# Patient Record
Sex: Male | Born: 1975 | Race: White | Hispanic: No | Marital: Married | Smoking: Never smoker
Health system: Southern US, Community
[De-identification: ages and names within clinical notes are randomized; demographics above are authoritative.]

## PROBLEM LIST (undated history)

## (undated) DIAGNOSIS — M109 Gout, unspecified: Secondary | ICD-10-CM

---

## 2014-06-26 ENCOUNTER — Ambulatory Visit
Admission: RE | Admit: 2014-06-26 | Discharge: 2014-06-26 | Disposition: A | Discharge status: Home / Self Care | Payer: BC Managed Care – PPO | Source: Ambulatory Visit | Attending: Family Medicine | Admitting: Family Medicine

## 2014-06-26 ENCOUNTER — Other Ambulatory Visit: Payer: Self-pay | Admitting: Family Medicine

## 2014-06-26 DIAGNOSIS — M25571 Pain in right ankle and joints of right foot: Secondary | ICD-10-CM

## 2016-01-20 DIAGNOSIS — Z13220 Encounter for screening for lipoid disorders: Secondary | ICD-10-CM | POA: Diagnosis not present

## 2016-01-20 DIAGNOSIS — Z0000 Encounter for general adult medical examination without abnormal findings: Secondary | ICD-10-CM | POA: Diagnosis not present

## 2016-01-20 DIAGNOSIS — Z125 Encounter for screening for malignant neoplasm of prostate: Secondary | ICD-10-CM | POA: Diagnosis not present

## 2016-01-20 DIAGNOSIS — M109 Gout, unspecified: Secondary | ICD-10-CM | POA: Diagnosis not present

## 2016-05-03 DIAGNOSIS — Z23 Encounter for immunization: Secondary | ICD-10-CM | POA: Diagnosis not present

## 2016-06-08 DIAGNOSIS — M542 Cervicalgia: Secondary | ICD-10-CM | POA: Diagnosis not present

## 2016-06-08 DIAGNOSIS — M25562 Pain in left knee: Secondary | ICD-10-CM | POA: Diagnosis not present

## 2016-07-04 DIAGNOSIS — M109 Gout, unspecified: Secondary | ICD-10-CM | POA: Diagnosis not present

## 2016-07-04 DIAGNOSIS — K529 Noninfective gastroenteritis and colitis, unspecified: Secondary | ICD-10-CM | POA: Diagnosis not present

## 2016-07-28 ENCOUNTER — Emergency Department (HOSPITAL_COMMUNITY)
Admission: EM | Admit: 2016-07-28 | Discharge: 2016-07-28 | Disposition: A | Discharge status: Home / Self Care | Payer: BC Managed Care – PPO | Attending: Emergency Medicine | Admitting: Emergency Medicine

## 2016-07-28 ENCOUNTER — Emergency Department (HOSPITAL_COMMUNITY): Payer: BC Managed Care – PPO

## 2016-07-28 ENCOUNTER — Encounter (HOSPITAL_COMMUNITY): Payer: Self-pay

## 2016-07-28 DIAGNOSIS — N202 Calculus of kidney with calculus of ureter: Secondary | ICD-10-CM | POA: Diagnosis not present

## 2016-07-28 DIAGNOSIS — N201 Calculus of ureter: Secondary | ICD-10-CM

## 2016-07-28 DIAGNOSIS — R1032 Left lower quadrant pain: Secondary | ICD-10-CM | POA: Diagnosis present

## 2016-07-28 DIAGNOSIS — N132 Hydronephrosis with renal and ureteral calculous obstruction: Secondary | ICD-10-CM | POA: Diagnosis not present

## 2016-07-28 HISTORY — DX: Gout, unspecified: M10.9

## 2016-07-28 LAB — BASIC METABOLIC PANEL
ANION GAP: 15 (ref 5–15)
BUN: 25 mg/dL — ABNORMAL HIGH (ref 6–20)
CO2: 18 mmol/L — ABNORMAL LOW (ref 22–32)
Calcium: 9.4 mg/dL (ref 8.9–10.3)
Chloride: 103 mmol/L (ref 101–111)
Creatinine, Ser: 1.35 mg/dL — ABNORMAL HIGH (ref 0.61–1.24)
GFR calc Af Amer: >60 mL/min (ref >60)
GFR calc non Af Amer: >60 mL/min (ref >60)
GLUCOSE: 146 mg/dL — AB (ref 65–99)
POTASSIUM: 3.7 mmol/L (ref 3.5–5.1)
Sodium: 136 mmol/L (ref 135–145)

## 2016-07-28 LAB — URINALYSIS, ROUTINE W REFLEX MICROSCOPIC
Bacteria, UA: NONE SEEN
Bilirubin Urine: NEGATIVE
Glucose, UA: NEGATIVE mg/dL
Ketones, ur: 20 mg/dL — AB
Leukocytes, UA: NEGATIVE
Nitrite: NEGATIVE
PH: 5 (ref 5.0–8.0)
Protein, ur: 30 mg/dL — AB
SPECIFIC GRAVITY, URINE: 1.035 — AB (ref 1.005–1.030)
SQUAMOUS EPITHELIAL / LPF: NONE SEEN

## 2016-07-28 LAB — CBC
HCT: 40.1 % (ref 39.0–52.0)
Hemoglobin: 13.9 g/dL (ref 13.0–17.0)
MCH: 28.5 pg (ref 26.0–34.0)
MCHC: 34.7 g/dL (ref 30.0–36.0)
MCV: 82.3 fL (ref 78.0–100.0)
Platelets: 204 10*3/uL (ref 150–400)
RBC: 4.87 MIL/uL (ref 4.22–5.81)
RDW: 13.1 % (ref 11.5–15.5)
WBC: 8.6 10*3/uL (ref 4.0–10.5)

## 2016-07-28 MED ORDER — TAMSULOSIN HCL 0.4 MG PO CAPS: 0.4000 mg | ORAL_CAPSULE | Freq: Every day | ORAL | 0 refills | Status: AC

## 2016-07-28 MED ORDER — FENTANYL CITRATE (PF) 100 MCG/2ML IJ SOLN
INTRAMUSCULAR | Status: AC
  Filled 2016-07-28: qty 2

## 2016-07-28 MED ORDER — KETOROLAC TROMETHAMINE 30 MG/ML IJ SOLN
30.0000 mg | Freq: Once | INTRAMUSCULAR | Status: AC
  Administered 2016-07-28: 30 mg via INTRAVENOUS
  Filled 2016-07-28: qty 1

## 2016-07-28 MED ORDER — FENTANYL CITRATE (PF) 100 MCG/2ML IJ SOLN
50.0000 ug | INTRAMUSCULAR | Status: DC | PRN
  Administered 2016-07-28: 50 ug via NASAL

## 2016-07-28 MED ORDER — ONDANSETRON 8 MG PO TBDP: 8.0000 mg | ORAL_TABLET | Freq: Three times a day (TID) | ORAL | 0 refills | Status: AC | PRN

## 2016-07-28 MED ORDER — HYDROMORPHONE HCL 2 MG/ML IJ SOLN
1.0000 mg | Freq: Once | INTRAMUSCULAR | Status: AC
  Administered 2016-07-28: 1 mg via INTRAVENOUS
  Filled 2016-07-28: qty 1

## 2016-07-28 MED ORDER — IBUPROFEN 400 MG PO TABS: 400.0000 mg | ORAL_TABLET | Freq: Three times a day (TID) | ORAL | 0 refills | Status: AC | PRN

## 2016-07-28 MED ORDER — OXYCODONE-ACETAMINOPHEN 5-325 MG PO TABS: 1.0000 | ORAL_TABLET | ORAL | 0 refills | Status: AC | PRN

## 2016-07-28 NOTE — ED Triage Notes (Signed)
Pt states that around 11pm he started having LLQ pain with bilateral flank pain, pain radiates down into his left groin area. Pt has vomited X4. No hx of stones. C/o dysuria and frequency.

## 2016-07-28 NOTE — ED Provider Notes (Signed)
[facility]-EMERGENCY DEPT Provider Note   CSN: [id number] Arrival date & time: 07/28/16  0227     History   Chief Complaint Chief Complaint  Patient presents with  . Flank Pain  . Groin Pain    HPI Lee MalkinRobert Flores is a 41 y.o. male.  HPI Patient presents to the emergency department with acute onset left-sided abdominal and left groin and left testicle pain.  No dysuria or urinary frequency.  He reports he had some left flank pain as well.  No prior history kidney stones.  His pain is severe in severity.  He is writhing in bed at time of my evaluation.  Patient reports nausea and vomiting 4.  No diarrhea.  No recent sick contacts.  No chest pain shortness breath   Past Medical History:  Diagnosis Date  . Gout     There are no active problems to display for this patient.   History reviewed. No pertinent surgical history.     Home Medications    Prior to Admission medications   Medication Sig Start Date End Date Taking? Authorizing Provider  allopurinol (ZYLOPRIM) 100 MG tablet Take 200-300 mg by mouth See admin instructions. Take 3 tablets every morning and take 2 tablets every evening 07/16/16  Yes Historical Provider, MD  ibuprofen (ADVIL,MOTRIN) 400 MG tablet Take 1 tablet (400 mg total) by mouth every 8 (eight) hours as needed. 07/28/16   Azalia BilisKevin Malvern Kadlec, MD  ondansetron (ZOFRAN ODT) 8 MG disintegrating tablet Take 1 tablet (8 mg total) by mouth every 8 (eight) hours as needed for nausea or vomiting. 07/28/16   Azalia BilisKevin Yolander Goodie, MD  oxyCODONE-acetaminophen (PERCOCET/ROXICET) 5-325 MG tablet Take 1 tablet by mouth every 4 (four) hours as needed for severe pain. 07/28/16   Azalia BilisKevin Arbor Cohen, MD  tamsulosin (FLOMAX) 0.4 MG CAPS capsule Take 1 capsule (0.4 mg total) by mouth daily. 07/28/16   Azalia BilisKevin Maidie Streight, MD    Family History No family history on file.  Social History Social History  Substance Use Topics  . Smoking status: Never Smoker  . Smokeless tobacco: Never Used  . Alcohol use  No     Allergies   Oxycodone   Review of Systems Review of Systems  All other systems reviewed and are negative.    Physical Exam Updated Vital Signs BP 128/83   Pulse 93   Temp 97.4 F (36.3 C) (Oral)   Resp 14   Ht 6\' 4"  (1.93 m)   Wt 215 lb (97.5 kg)   SpO2 (!) 85%   BMI 26.17 kg/m   Physical Exam  Constitutional: He is oriented to person, place, and time.  HENT:  Head: Normocephalic and atraumatic.  Eyes: EOM are normal.  Neck: Normal range of motion.  Cardiovascular: Normal rate, regular rhythm and intact distal pulses.   Pulmonary/Chest: Effort normal and breath sounds normal. No respiratory distress.  Abdominal: He exhibits no distension. There is no tenderness.  No left inguinal swelling or tenderness.  Genitourinary:  Genitourinary Comments: No left testicular tenderness  Musculoskeletal: Normal range of motion.  Neurological: He is alert and oriented to person, place, and time.  Skin: Skin is warm and dry.  Nursing note and vitals reviewed.    ED Treatments / Results  Labs (all labs ordered are listed, but only abnormal results are displayed) Labs Reviewed  URINALYSIS, ROUTINE W REFLEX MICROSCOPIC - Abnormal; Notable for the following:       Result Value   APPearance HAZY (*)    Specific Gravity, Urine  1.035 (*)    Hgb urine dipstick LARGE (*)    Ketones, ur 20 (*)    Protein, ur 30 (*)    All other components within normal limits  BASIC METABOLIC PANEL - Abnormal; Notable for the following:    CO2 18 (*)    Glucose, Bld 146 (*)    BUN 25 (*)    Creatinine, Ser 1.35 (*)    All other components within normal limits  CBC    EKG  EKG Interpretation None       Radiology Ct Renal Stone Study  Result Date: 07/28/2016 CLINICAL DATA:  Left flank pain and groin pain. EXAM: CT ABDOMEN AND PELVIS WITHOUT CONTRAST TECHNIQUE: Multidetector CT imaging of the abdomen and pelvis was performed following the standard protocol without IV contrast.  COMPARISON:  None. FINDINGS: Lower chest: Lung bases are clear. Hepatobiliary: No focal liver abnormality is seen. No gallstones, gallbladder wall thickening, or biliary dilatation. Pancreas: Unremarkable. No pancreatic ductal dilatation or surrounding inflammatory changes. Spleen: Normal in size without focal abnormality. Adrenals/Urinary Tract: No adrenal gland nodules. 3 mm stone in the distal left ureter at the ureterovesical junction. Proximal hydronephrosis and hydroureter. Stranding around the left kidney and ureter. Additional punctate sized intrarenal stone on the left. Right kidney and ureter are decompressed with normal appearance. Bladder is decompressed. Stomach/Bowel: Stomach is within normal limits. Appendix appears normal. No evidence of bowel wall thickening, distention, or inflammatory changes. Vascular/Lymphatic: No significant vascular findings are present. No enlarged abdominal or pelvic lymph nodes. Reproductive: Prostate is unremarkable. Other: No abdominal wall hernia or abnormality. No abdominopelvic ascites. Musculoskeletal: No acute or significant osseous findings. IMPRESSION: 3 mm stone in the distal left ureter with moderate proximal obstruction. Additional nonobstructing stone in the left kidney. Electronically Signed   By: Burman Nieves M.D.   On: 07/28/2016 05:43    Procedures Procedures (including critical care time)  Medications Ordered in ED Medications  fentaNYL (SUBLIMAZE) injection 50 mcg (50 mcg Nasal Given 07/28/16 0247)  fentaNYL (SUBLIMAZE) 100 MCG/2ML injection (not administered)  HYDROmorphone (DILAUDID) injection 1 mg (1 mg Intravenous Given 07/28/16 0431)  ketorolac (TORADOL) 30 MG/ML injection 30 mg (30 mg Intravenous Given 07/28/16 0431)  HYDROmorphone (DILAUDID) injection 1 mg (1 mg Intravenous Given 07/28/16 0525)     Initial Impression / Assessment and Plan / ED Course  I have reviewed the triage vital signs and the nursing notes.  Pertinent labs &  imaging results that were available during my care of the patient were reviewed by me and considered in my medical decision making (see chart for details).  Clinical Course     Patient's pain improved and was controlled here in emergency department.  Outpatient urology follow-up.  Standard sternal precautions.  Standard home medications.  He understands to return to the [facility] emergency department for any new or worsening symptoms  Final Clinical Impressions(s) / ED Diagnoses   Final diagnoses:  Left ureteral stone    New Prescriptions Discharge Medication List as of 07/28/2016  7:22 AM    START taking these medications   Details  ibuprofen (ADVIL,MOTRIN) 400 MG tablet Take 1 tablet (400 mg total) by mouth every 8 (eight) hours as needed., Starting Thu 07/28/2016, Print    ondansetron (ZOFRAN ODT) 8 MG disintegrating tablet Take 1 tablet (8 mg total) by mouth every 8 (eight) hours as needed for nausea or vomiting., Starting Thu 07/28/2016, Print    oxyCODONE-acetaminophen (PERCOCET/ROXICET) 5-325 MG tablet Take 1 tablet by mouth  every 4 (four) hours as needed for severe pain., Starting Thu 07/28/2016, Print    tamsulosin (FLOMAX) 0.4 MG CAPS capsule Take 1 capsule (0.4 mg total) by mouth daily., Starting Thu 07/28/2016, Print         Azalia Bilis, MD 07/28/16 [phone]

## 2017-01-24 DIAGNOSIS — Z125 Encounter for screening for malignant neoplasm of prostate: Secondary | ICD-10-CM | POA: Diagnosis not present

## 2017-01-24 DIAGNOSIS — M109 Gout, unspecified: Secondary | ICD-10-CM | POA: Diagnosis not present

## 2017-01-24 DIAGNOSIS — Z0000 Encounter for general adult medical examination without abnormal findings: Secondary | ICD-10-CM | POA: Diagnosis not present

## 2017-05-16 DIAGNOSIS — Z23 Encounter for immunization: Secondary | ICD-10-CM | POA: Diagnosis not present

## 2017-05-21 DIAGNOSIS — S39012A Strain of muscle, fascia and tendon of lower back, initial encounter: Secondary | ICD-10-CM | POA: Diagnosis not present

## 2017-05-21 DIAGNOSIS — Z87442 Personal history of urinary calculi: Secondary | ICD-10-CM | POA: Diagnosis not present

## 2017-09-17 ENCOUNTER — Other Ambulatory Visit: Payer: Self-pay | Admitting: Family

## 2017-09-17 MED ORDER — OSELTAMIVIR PHOSPHATE 75 MG PO CAPS: 75.0000 mg | ORAL_CAPSULE | Freq: Two times a day (BID) | ORAL | 0 refills | Status: DC

## 2017-09-17 MED ORDER — OSELTAMIVIR PHOSPHATE 75 MG PO CAPS: 75.0000 mg | ORAL_CAPSULE | Freq: Every day | ORAL | 0 refills | Status: AC

## 2017-09-17 NOTE — Progress Notes (Signed)
Son positive for Influenza A

## 2018-01-26 DIAGNOSIS — Z136 Encounter for screening for cardiovascular disorders: Secondary | ICD-10-CM | POA: Diagnosis not present

## 2018-01-26 DIAGNOSIS — M109 Gout, unspecified: Secondary | ICD-10-CM | POA: Diagnosis not present

## 2018-01-26 DIAGNOSIS — Z125 Encounter for screening for malignant neoplasm of prostate: Secondary | ICD-10-CM | POA: Diagnosis not present

## 2018-01-26 DIAGNOSIS — Z0000 Encounter for general adult medical examination without abnormal findings: Secondary | ICD-10-CM | POA: Diagnosis not present

## 2018-05-11 DIAGNOSIS — Z23 Encounter for immunization: Secondary | ICD-10-CM | POA: Diagnosis not present

## 2018-11-20 IMAGING — CT CT RENAL STONE PROTOCOL
2 of 4 series · 10 of 46 positions shown, 11 images · non-contrast
Comparison: None.

CLINICAL DATA: Left flank pain and groin pain.

EXAM:
CT ABDOMEN AND PELVIS WITHOUT CONTRAST
TECHNIQUE: Multidetector CT imaging of the abdomen and pelvis was performed
following the standard protocol without IV contrast.

[Series 201: stone study, idose (2) · axial · 0.74mm/px · z∈[+479,+909]mm · 7 of 104 slices shown, 8 images]
[im 9/104  soft-tissue]
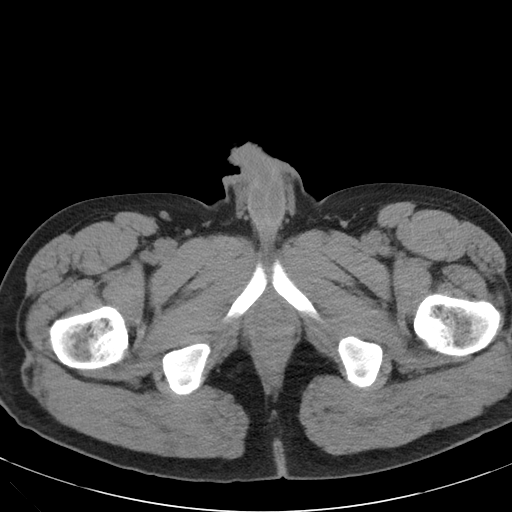
[im 9/104  bone]
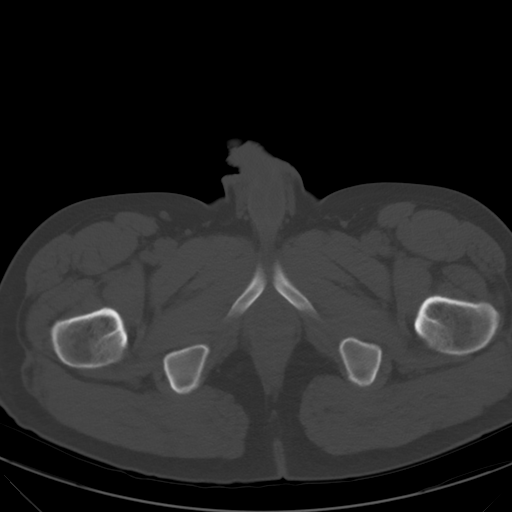
[im 22/104  soft-tissue]
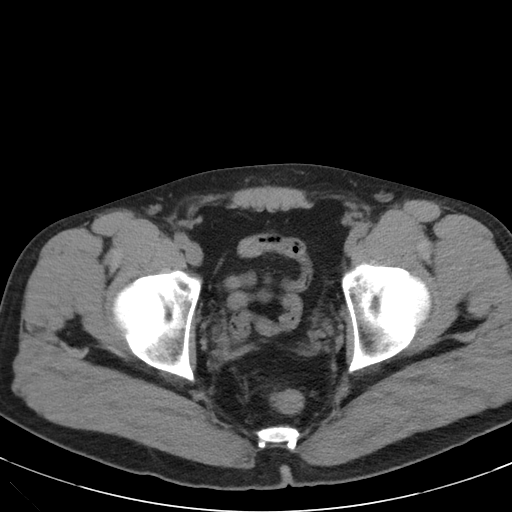
[im 39/104  soft-tissue]
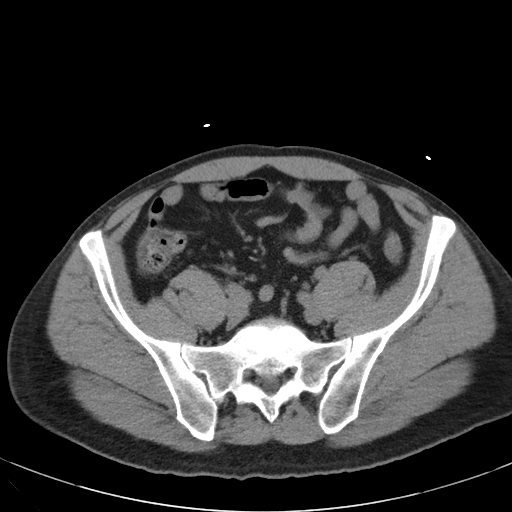
[im 52/104  soft-tissue]
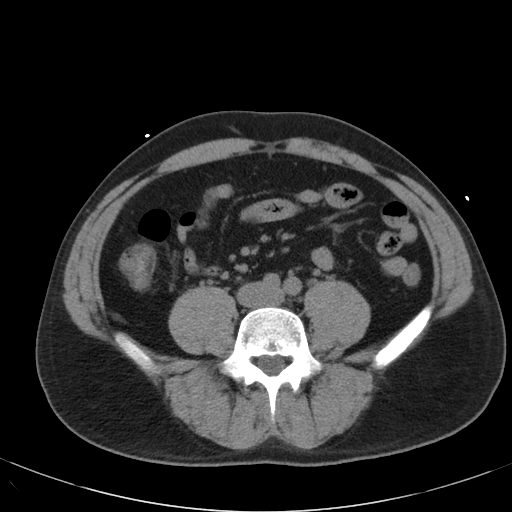
[im 65/104  soft-tissue]
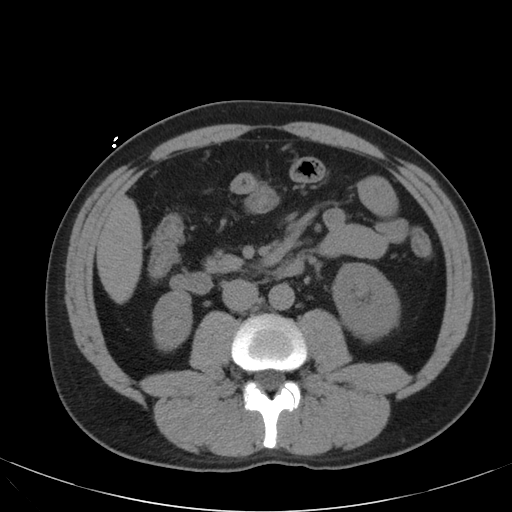
[im 82/104  soft-tissue]
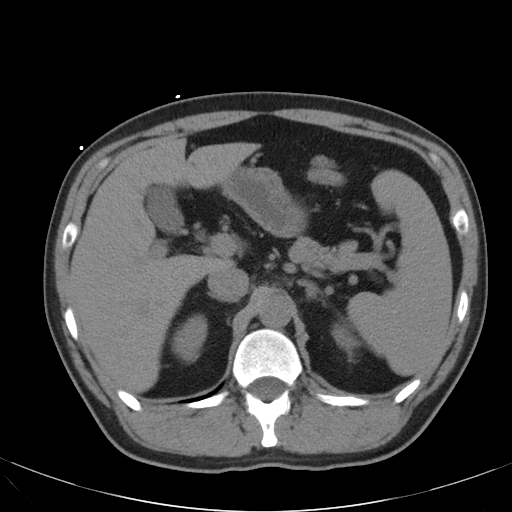
[im 95/104  soft-tissue]
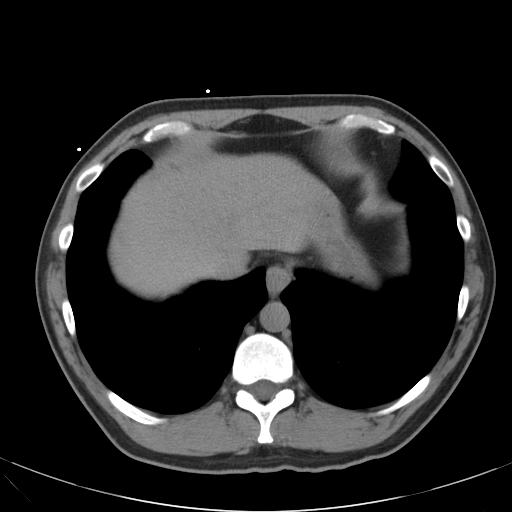

[Series 203: coronals, idose (2) · coronal · 0.45mm/px · 3 of 124 slices shown]
[im 42/124  soft-tissue]
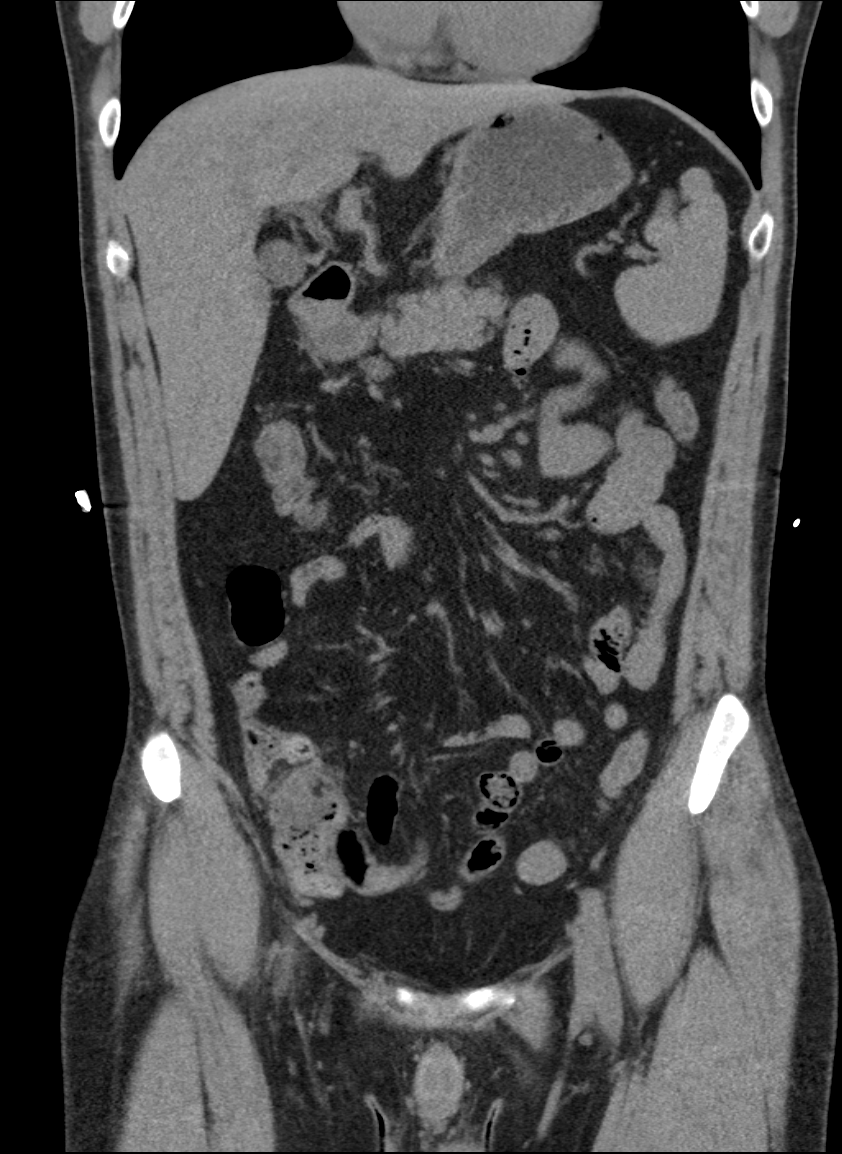
[im 55/124  soft-tissue]
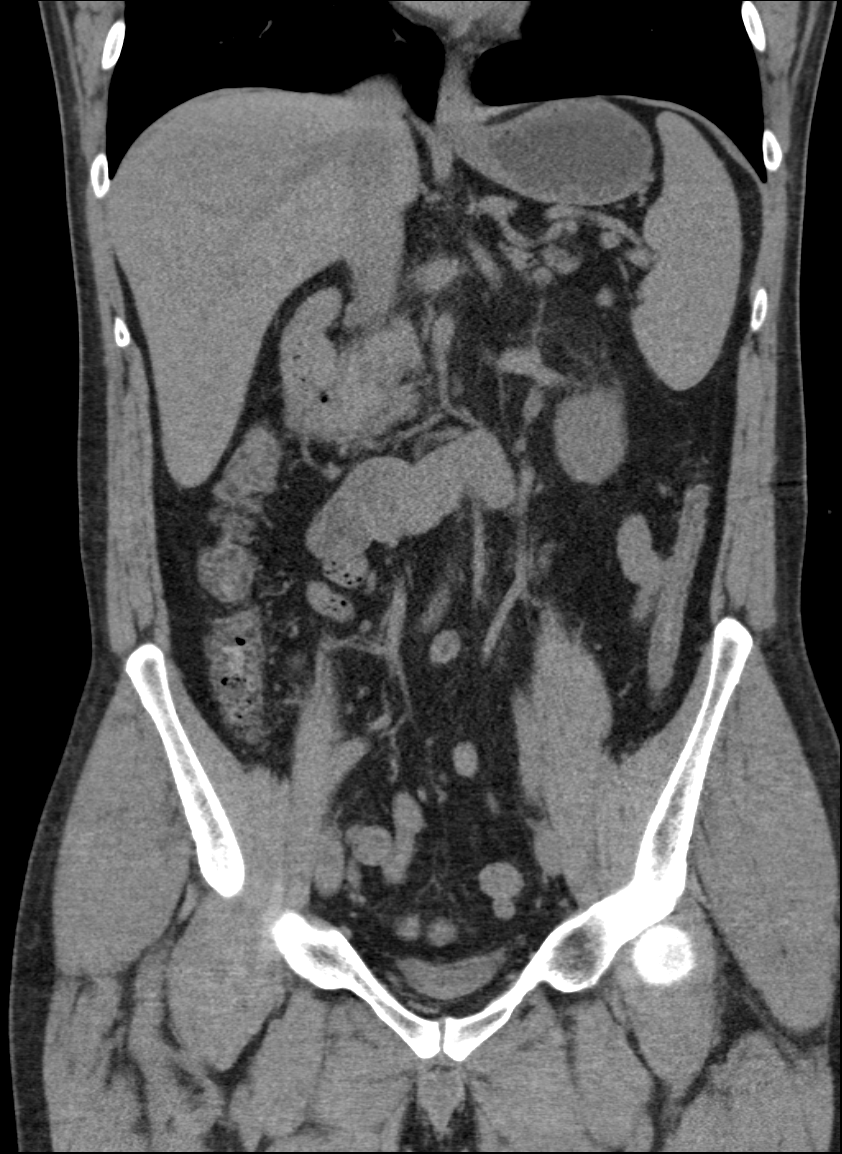
[im 69/124  soft-tissue]
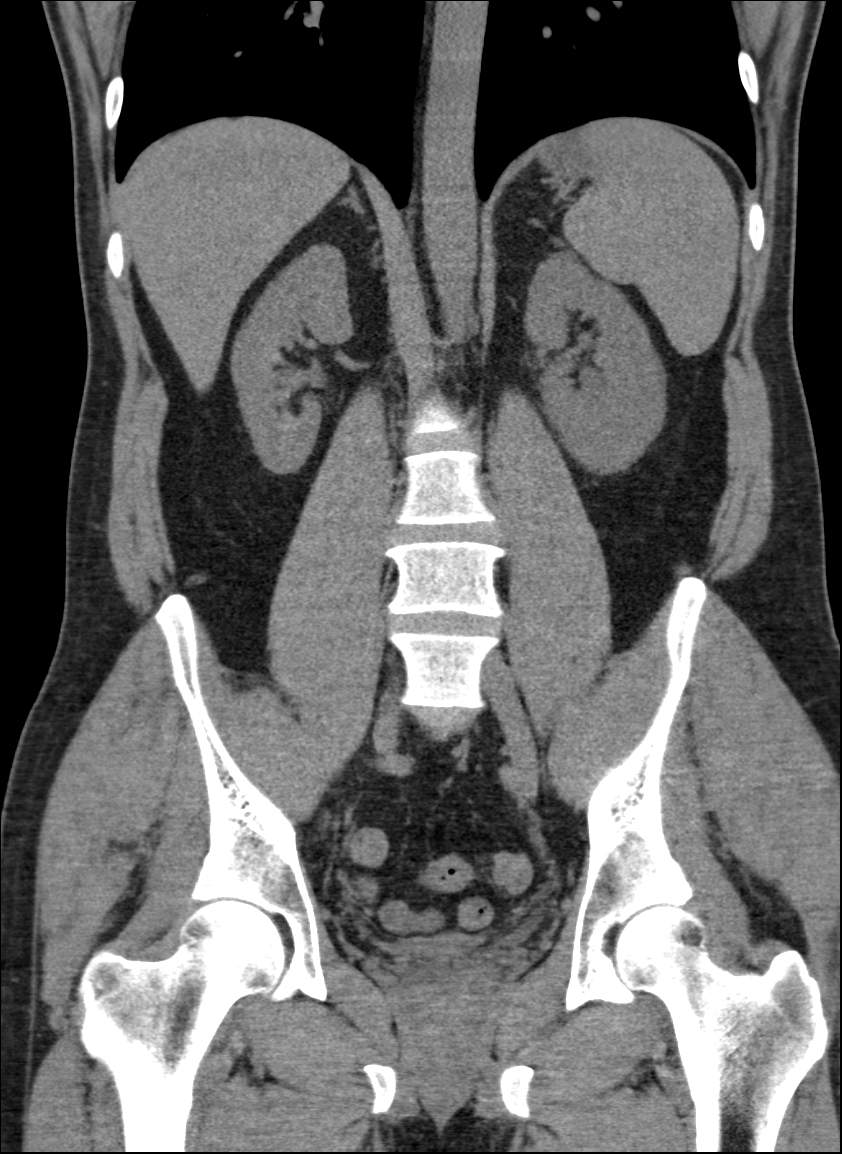

[10 of 46 positions shown; findings below may reference images not displayed]

FINDINGS: Lower chest: Lung bases are clear.

Hepatobiliary: No focal liver abnormality is seen. No gallstones,
gallbladder wall thickening, or biliary dilatation.

Pancreas: Unremarkable. No pancreatic ductal dilatation or
surrounding inflammatory changes.

Spleen: Normal in size without focal abnormality.

Adrenals/Urinary Tract: No adrenal gland nodules. 3 mm stone in the
distal left ureter at the ureterovesical junction. Proximal
hydronephrosis and hydroureter. Stranding around the left kidney and
ureter. Additional punctate sized intrarenal stone on the left.
Right kidney and ureter are decompressed with normal appearance.
Bladder is decompressed.

Stomach/Bowel: Stomach is within normal limits. Appendix appears
normal. No evidence of bowel wall thickening, distention, or
inflammatory changes.

Vascular/Lymphatic: No significant vascular findings are present. No
enlarged abdominal or pelvic lymph nodes.

Reproductive: Prostate is unremarkable.

Other: No abdominal wall hernia or abnormality. No abdominopelvic
ascites.

Musculoskeletal: No acute or significant osseous findings.
IMPRESSION: 3 mm stone in the distal left ureter with moderate proximal
obstruction. Additional nonobstructing stone in the left kidney.

## 2018-11-22 DIAGNOSIS — M2550 Pain in unspecified joint: Secondary | ICD-10-CM | POA: Diagnosis not present

## 2018-11-22 DIAGNOSIS — M109 Gout, unspecified: Secondary | ICD-10-CM | POA: Diagnosis not present

## 2018-11-28 DIAGNOSIS — R7309 Other abnormal glucose: Secondary | ICD-10-CM | POA: Diagnosis not present

## 2018-11-28 DIAGNOSIS — M2550 Pain in unspecified joint: Secondary | ICD-10-CM | POA: Diagnosis not present

## 2019-02-15 DIAGNOSIS — Z0000 Encounter for general adult medical examination without abnormal findings: Secondary | ICD-10-CM | POA: Diagnosis not present

## 2019-02-15 DIAGNOSIS — M109 Gout, unspecified: Secondary | ICD-10-CM | POA: Diagnosis not present

## 2019-02-15 DIAGNOSIS — Z13220 Encounter for screening for lipoid disorders: Secondary | ICD-10-CM | POA: Diagnosis not present

## 2019-02-15 DIAGNOSIS — Z125 Encounter for screening for malignant neoplasm of prostate: Secondary | ICD-10-CM | POA: Diagnosis not present

## 2019-02-15 DIAGNOSIS — R7309 Other abnormal glucose: Secondary | ICD-10-CM | POA: Diagnosis not present

## 2019-04-17 DIAGNOSIS — Z23 Encounter for immunization: Secondary | ICD-10-CM | POA: Diagnosis not present

## 2019-05-14 DIAGNOSIS — R05 Cough: Secondary | ICD-10-CM | POA: Diagnosis not present

## 2019-05-14 DIAGNOSIS — J069 Acute upper respiratory infection, unspecified: Secondary | ICD-10-CM | POA: Diagnosis not present

## 2020-03-11 DIAGNOSIS — Z13220 Encounter for screening for lipoid disorders: Secondary | ICD-10-CM | POA: Diagnosis not present

## 2020-03-11 DIAGNOSIS — R7309 Other abnormal glucose: Secondary | ICD-10-CM | POA: Diagnosis not present

## 2020-03-11 DIAGNOSIS — Z0000 Encounter for general adult medical examination without abnormal findings: Secondary | ICD-10-CM | POA: Diagnosis not present

## 2020-03-11 DIAGNOSIS — M109 Gout, unspecified: Secondary | ICD-10-CM | POA: Diagnosis not present

## 2020-03-11 DIAGNOSIS — Z125 Encounter for screening for malignant neoplasm of prostate: Secondary | ICD-10-CM | POA: Diagnosis not present

## 2020-03-13 ENCOUNTER — Other Ambulatory Visit: Payer: Self-pay | Admitting: Family Medicine

## 2020-03-13 DIAGNOSIS — M799 Soft tissue disorder, unspecified: Secondary | ICD-10-CM

## 2020-03-18 ENCOUNTER — Ambulatory Visit
Admission: RE | Admit: 2020-03-18 | Discharge: 2020-03-18 | Disposition: A | Discharge status: Home / Self Care | Payer: Self-pay | Source: Ambulatory Visit | Attending: Family Medicine | Admitting: Family Medicine

## 2020-03-18 DIAGNOSIS — R221 Localized swelling, mass and lump, neck: Secondary | ICD-10-CM | POA: Diagnosis not present

## 2020-03-18 DIAGNOSIS — M799 Soft tissue disorder, unspecified: Secondary | ICD-10-CM

## 2020-03-18 DIAGNOSIS — L723 Sebaceous cyst: Secondary | ICD-10-CM | POA: Diagnosis not present

## 2020-04-14 DIAGNOSIS — Z23 Encounter for immunization: Secondary | ICD-10-CM | POA: Diagnosis not present

## 2020-06-20 ENCOUNTER — Ambulatory Visit: Payer: BC Managed Care – PPO | Attending: Internal Medicine

## 2020-06-20 DIAGNOSIS — Z23 Encounter for immunization: Secondary | ICD-10-CM

## 2020-06-20 NOTE — Progress Notes (Signed)
   Covid-19 Vaccination Clinic  Name:  Lee Flores    MRN: [id number] DOB: 06-Feb-1976  06/20/2020  Mr. Lee Flores was observed post Covid-19 immunization for 15 minutes without incident. He was provided with Vaccine Information Sheet and instruction to access the V-Safe system.   Mr. Lee Flores was instructed to call 911 with any severe reactions post vaccine: Marland Kitchen Difficulty breathing  . Swelling of face and throat  . A fast heartbeat  . A bad rash all over body  . Dizziness and weakness   Immunizations Administered    Name Date Dose VIS Date Route   Pfizer COVID-19 Vaccine 06/20/2020 10:49 AM 0.3 mL 05/06/2020 Intramuscular   Manufacturer: [employer], [employer]   Lot: [id number]   NDC: [address]-3

## 2022-07-11 IMAGING — US US SOFT TISSUE
1 series · 9 of 9 positions shown · non-contrast
Comparison: None.

CLINICAL DATA: 44-year-old male with palpable abnormality of the
right upper back. Query cyst or lipoma.

EXAM:
ULTRASOUND OF HEAD/NECK SOFT TISSUES
TECHNIQUE: Ultrasound examination of the head and neck soft tissues was
performed in the area of clinical concern.

[Series 1: us soft tissue · 0.03mm/px · 9 acquisitions, 9 frames shown]
[im 1/9]
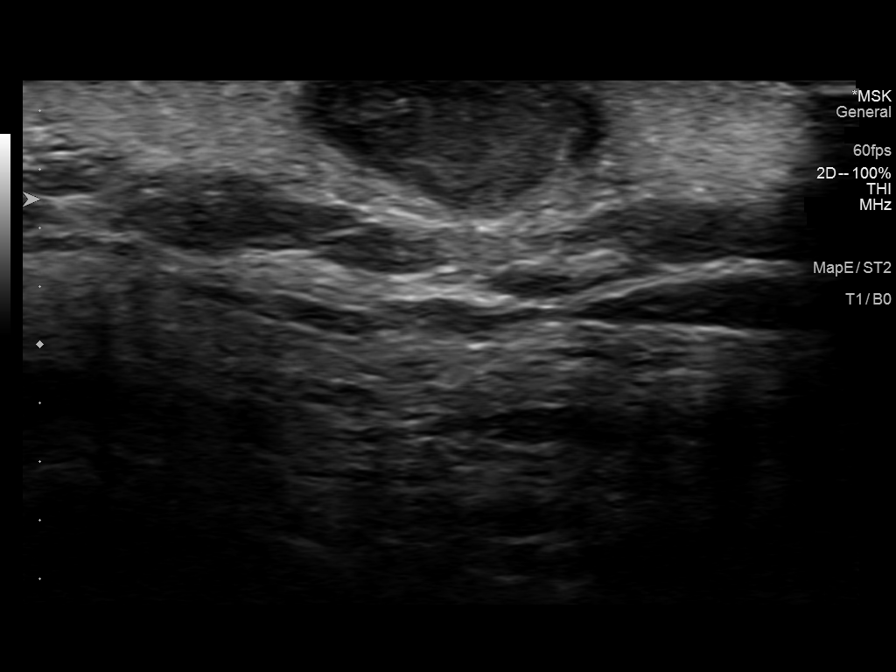
[im 2/9]
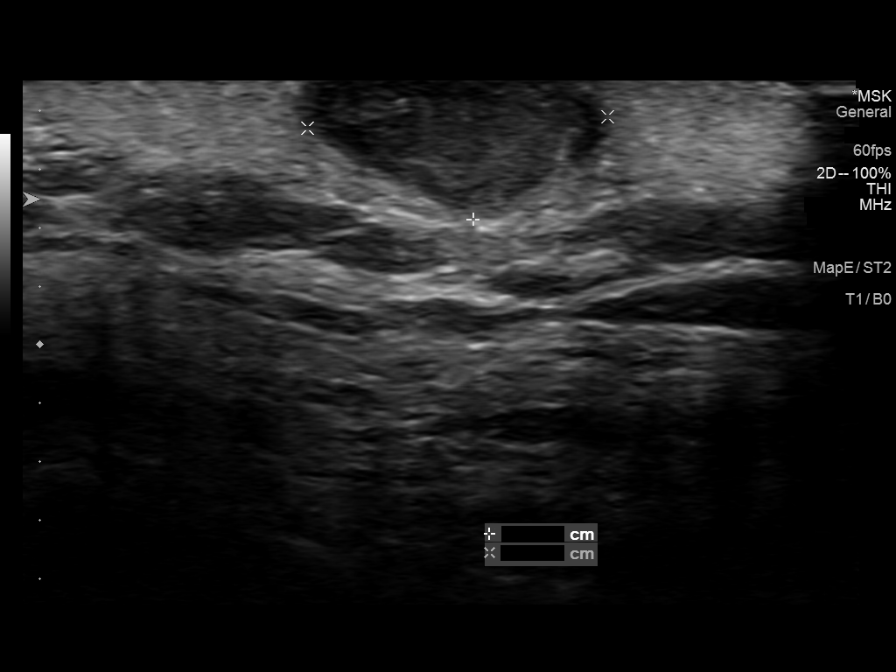
[im 3/9]
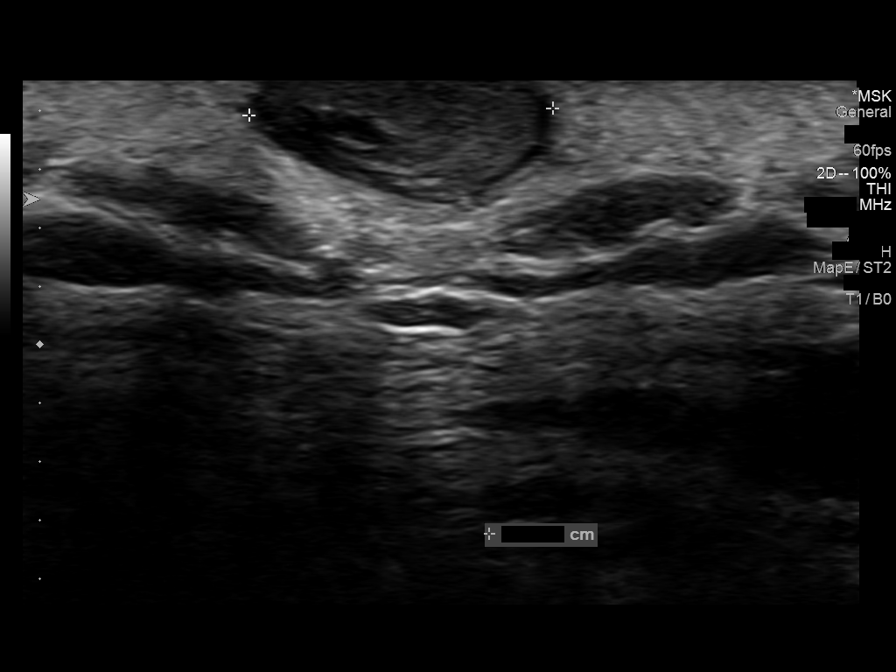
[im 4/9]
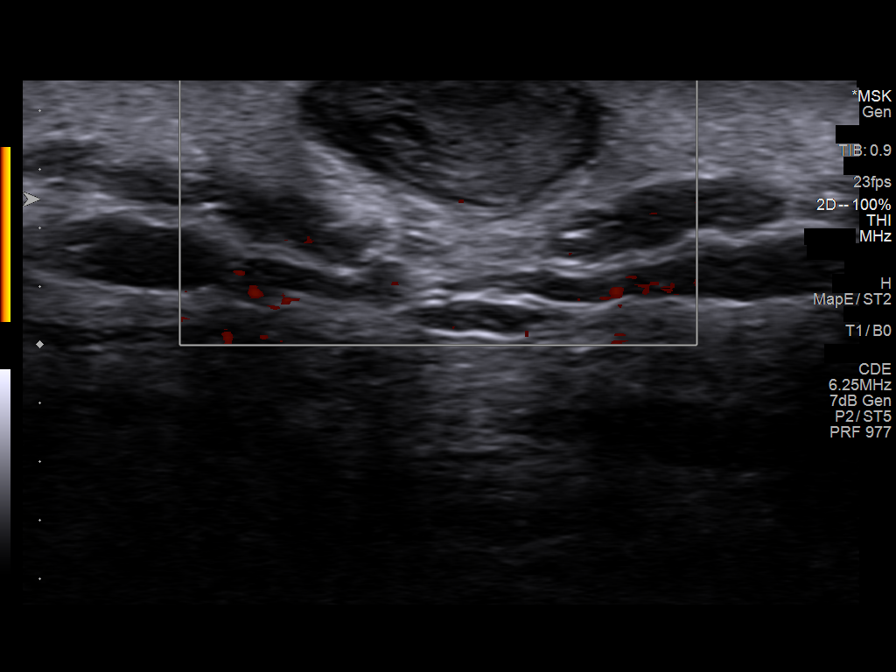
[im 5/9]
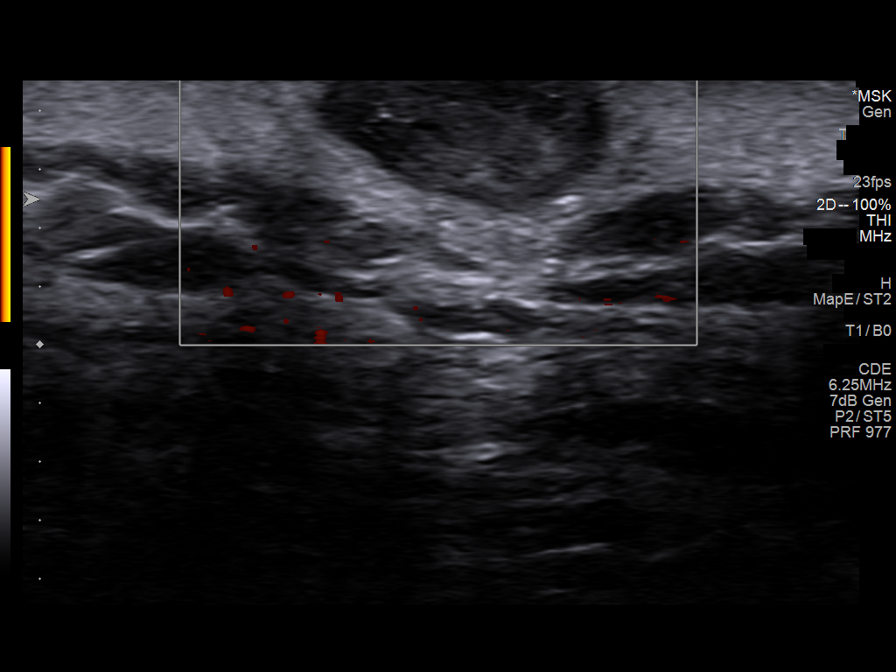
[im 6/9]
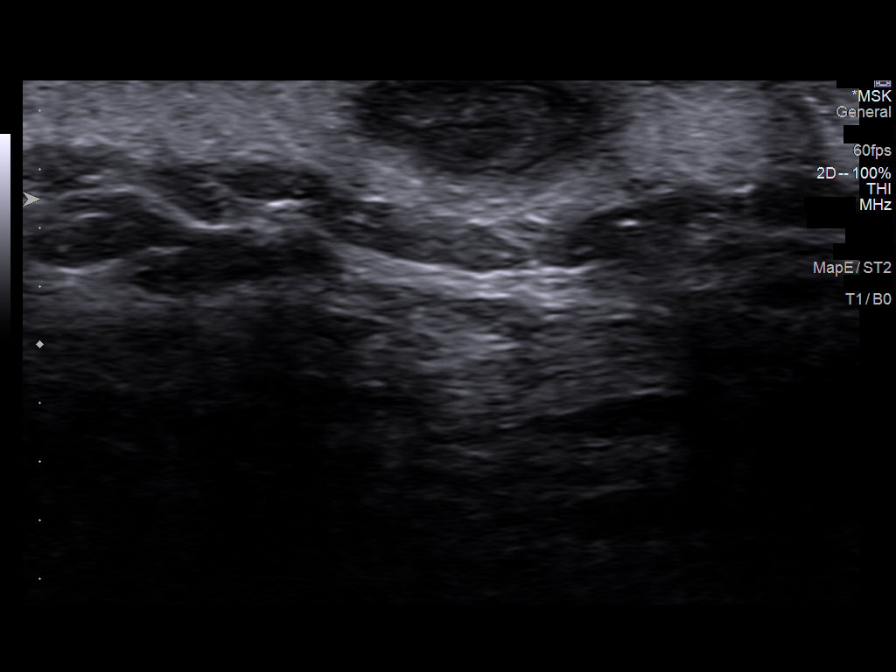
[im 7/9]
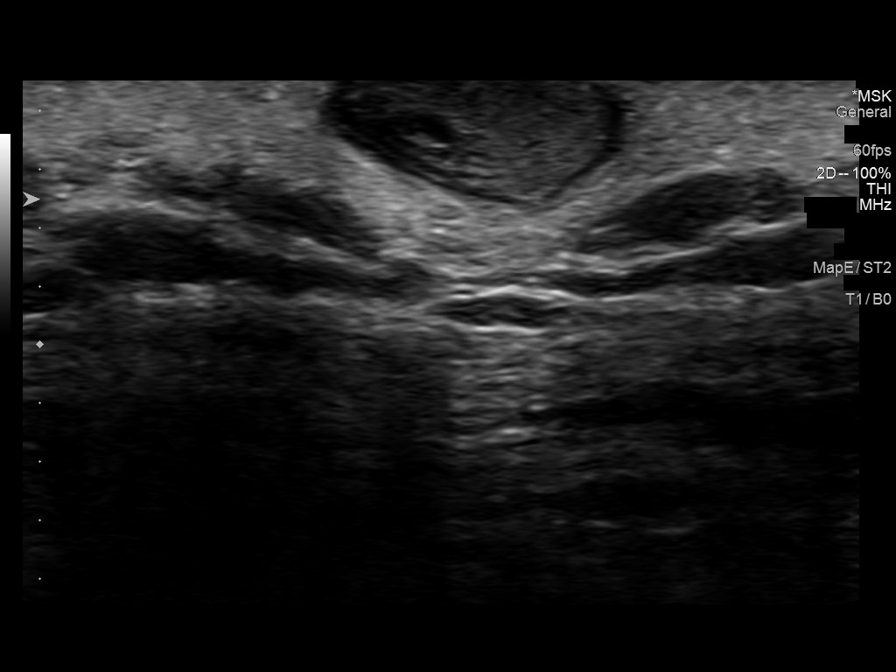
[im 8/9]
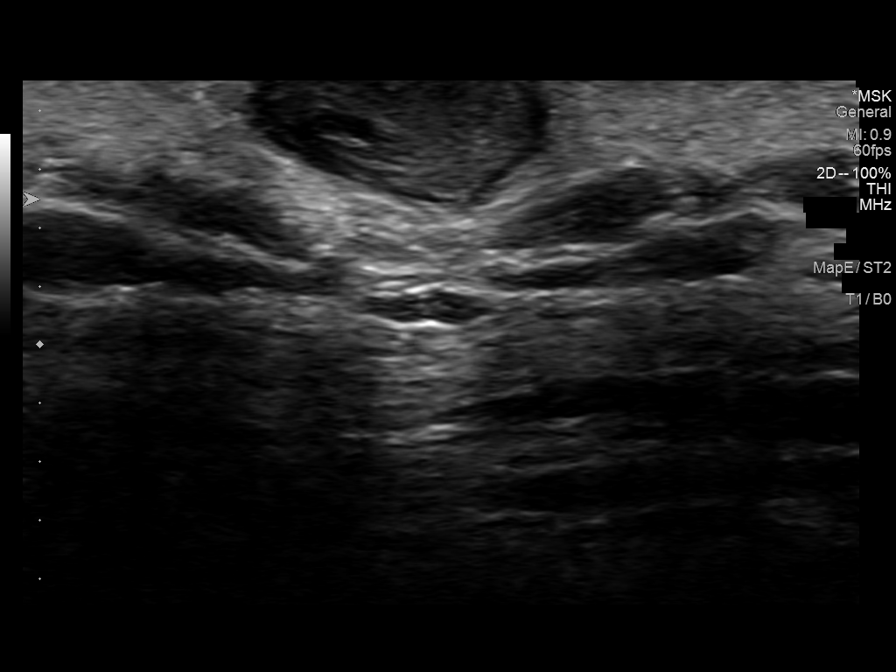
[im 9/9]
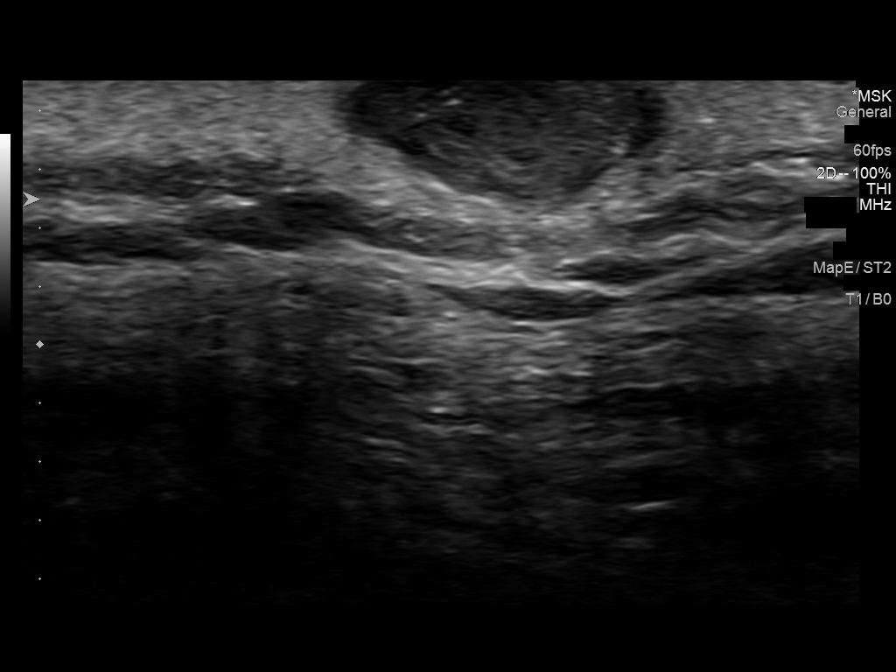

[9 of 9 positions shown; findings below may reference images not displayed]

FINDINGS: Grayscale and power Doppler images in the area of clinical concern
demonstrate a very superficial, oval 10 x 5 x 10 mm hypoechoic and
mildly complex appearing subcutaneous lesion (series 1, image 3)
with no internal vascularity detected on Doppler. There is increased
through transmission.

The other regional fibromuscular architecture appears normal.
IMPRESSION: Superficial 10 mm probable complex cyst at the area of clinical
concern. Favor sebaceous cyst or similar etiology.
